# Patient Record
Sex: Male | Born: 1990 | Race: White | Hispanic: No | Marital: Single | Smoking: Current every day smoker
Health system: Southern US, Community
[De-identification: ages and names within clinical notes are randomized; demographics above are authoritative.]

---
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
## 2009-10-27 ENCOUNTER — Emergency Department: Payer: Self-pay | Admitting: Emergency Medicine

## 2009-11-02 ENCOUNTER — Emergency Department: Payer: Self-pay | Admitting: Emergency Medicine

## 2009-11-04 ENCOUNTER — Emergency Department: Payer: Self-pay | Admitting: Emergency Medicine

## 2011-05-08 ENCOUNTER — Emergency Department: Payer: Self-pay | Admitting: Emergency Medicine

## 2012-05-19 ENCOUNTER — Observation Stay: Payer: Self-pay | Admitting: Surgery

## 2012-05-19 LAB — COMPREHENSIVE METABOLIC PANEL
BUN: 19 mg/dL — ABNORMAL HIGH (ref 7–18)
Chloride: 105 mmol/L (ref 98–107)
EGFR (African American): >60
Potassium: 4.7 mmol/L (ref 3.5–5.1)
SGOT(AST): 27 U/L (ref 15–37)
Total Protein: 8.2 g/dL (ref 6.4–8.2)

## 2012-05-19 LAB — URINALYSIS, COMPLETE
Bacteria: NONE SEEN
Bilirubin,UR: NEGATIVE
Blood: NEGATIVE
Glucose,UR: NEGATIVE mg/dL (ref 0–75)
Ketone: NEGATIVE
Leukocyte Esterase: NEGATIVE
Nitrite: NEGATIVE
Protein: NEGATIVE
Specific Gravity: 1.03 (ref 1.003–1.030)
Squamous Epithelial: NONE SEEN
WBC UR: 3 /HPF (ref 0–5)

## 2012-05-19 LAB — CBC
HCT: 47.7 % (ref 40.0–52.0)
HGB: 16.1 g/dL (ref 13.0–18.0)
MCHC: 33.7 g/dL (ref 32.0–36.0)
RBC: 5.34 10*6/uL (ref 4.40–5.90)

## 2012-09-12 ENCOUNTER — Emergency Department: Payer: Self-pay | Admitting: Emergency Medicine

## 2012-09-12 LAB — CBC WITH DIFFERENTIAL/PLATELET
Eosinophil #: 0.3 10*3/uL (ref 0.0–0.7)
Lymphocyte #: 2.1 10*3/uL (ref 1.0–3.6)
MCH: 28.9 pg (ref 26.0–34.0)
MCV: 86 fL (ref 80–100)
RDW: 13.1 % (ref 11.5–14.5)

## 2012-09-13 ENCOUNTER — Inpatient Hospital Stay: Payer: Self-pay | Admitting: Internal Medicine

## 2012-09-13 LAB — CBC WITH DIFFERENTIAL/PLATELET
Basophil %: 0.7 %
Eosinophil #: 0.4 10*3/uL (ref 0.0–0.7)
HCT: 43.4 % (ref 40.0–52.0)
Lymphocyte #: 1.9 10*3/uL (ref 1.0–3.6)
MCH: 29.2 pg (ref 26.0–34.0)
MCV: 86 fL (ref 80–100)
Neutrophil #: 8 10*3/uL — ABNORMAL HIGH (ref 1.4–6.5)
Platelet: 302 10*3/uL (ref 150–440)
RBC: 5.07 10*6/uL (ref 4.40–5.90)

## 2014-01-23 ENCOUNTER — Emergency Department: Payer: Self-pay | Admitting: Internal Medicine

## 2014-03-05 ENCOUNTER — Inpatient Hospital Stay: Payer: Self-pay | Admitting: Internal Medicine

## 2014-03-05 LAB — COMPREHENSIVE METABOLIC PANEL
ALBUMIN: 4 g/dL (ref 3.4–5.0)
ALT: 43 U/L
ANION GAP: 5 — AB (ref 7–16)
AST: 32 U/L (ref 15–37)
Alkaline Phosphatase: 83 U/L
BUN: 18 mg/dL (ref 7–18)
Bilirubin,Total: 0.3 mg/dL (ref 0.2–1.0)
Calcium, Total: 8.6 mg/dL (ref 8.5–10.1)
Chloride: 105 mmol/L (ref 98–107)
Co2: 29 mmol/L (ref 21–32)
Creatinine: 1.3 mg/dL (ref 0.60–1.30)
EGFR (African American): >60
Glucose: 132 mg/dL — ABNORMAL HIGH (ref 65–99)
Osmolality: 281 (ref 275–301)
POTASSIUM: 4.2 mmol/L (ref 3.5–5.1)
Sodium: 139 mmol/L (ref 136–145)
TOTAL PROTEIN: 7.7 g/dL (ref 6.4–8.2)

## 2014-03-05 LAB — CBC
HCT: 46 % (ref 40.0–52.0)
HGB: 15.3 g/dL (ref 13.0–18.0)
MCH: 29.9 pg (ref 26.0–34.0)
MCHC: 33.3 g/dL (ref 32.0–36.0)
MCV: 90 fL (ref 80–100)
Platelet: 256 10*3/uL (ref 150–440)
RBC: 5.13 10*6/uL (ref 4.40–5.90)
RDW: 13.1 % (ref 11.5–14.5)
WBC: 10.5 10*3/uL (ref 3.8–10.6)

## 2014-03-10 LAB — CULTURE, BLOOD (SINGLE)
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
## 2014-06-28 NOTE — H&P (Signed)
PATIENT NAME:  Gordon Peters, Petar MR#:  [id number] DATE OF BIRTH:  August 10, 1990  DATE OF ADMISSION:  05/19/2012  CHIEF COMPLAINT:  Right lower quadrant pain.   HISTORY OF PRESENT ILLNESS:  This is a patient with a 16 to 18 hour history of bilateral lower quadrant pain, right greater than left, nausea, multiple emesis, normal bowel movement, no diarrhea.  No fevers or chills.  Of note, he is worsening in the right lower quadrant and less so in the left.   He states he has had episodes like this in the past once requiring one week of admission to the hospital with observation.  No surgical procedures were performed.  This was several years ago.  His appendix has never been removed and he has no family history of Crohn's disease.   PAST MEDICAL HISTORY:  Remote asthma.  He has not had any medications in years.   PAST SURGICAL HISTORY:  Knee surgery multiples and dental surgery.   ALLERGIES:  ASPIRIN.   MEDICATIONS:  None.   FAMILY HISTORY:  Noncontributory.   SOCIAL HISTORY:  The patient smokes tobacco.  Does not drink alcohol to excess and works as a Pharmacist, communitycarnival ride operator.   REVIEW OF SYSTEMS:  A 10 system review has been performed and negative with the exception of that mentioned in the history of present illness.   PHYSICAL EXAMINATION: GENERAL:  Healthy, muscular-appearing Caucasian male patient.  VITAL SIGNS:  Temp of 98.8, pulse 102, respirations 19, blood pressure 137/75.  Pain scale of 8.  98% room air sat.  HEENT:  Shows no scleral icterus.  No palpable neck nodes.  CHEST:  Clear to auscultation.  CARDIAC:  Regular rate and rhythm.  ABDOMEN:  Soft, nondistended.  Tender bilaterally, but, maximally in the right lower quadrant with some guarding and a positive Rovsing's sign.  No percussion tenderness.  EXTREMITIES:  Without edema.  Calves are nontender.  NEUROLOGIC:  Grossly intact.  INTEGUMENT:  Shows multiple tattoos.   LABORATORY DATA:  White blood cell count is elevated.  Other  labs are normal.   CT scan is personally reviewed.  There is a suggestion of early appendicitis noted.   ASSESSMENT AND PLAN:  This is a patient with a history of prior episodes similar to this, one of which required hospitalization and observation, however presents with right lower quadrant pain and tenderness and leukocytosis and a questionable CT scan, but physical examination findings of appendicitis.  I have recommended laparoscopic appendectomy.  The rationale for this has been discussed.  The options of observation reviewed and the risks of bleeding, infection, recurrence, negative laparoscopy and alternative causes were reviewed with he and his significant other.  They understood and agreed to proceed.     ____________________________ Adah Salvageichard E. Excell Seltzerooper, MD rec:ea D: 05/19/2012 20:31:37 ET T: 05/20/2012 03:08:38 ET JOB#: [id number]  cc: Adah Salvageichard E. Excell Seltzerooper, MD, <Dictator> Lattie HawICHARD E COOPER MD ELECTRONICALLY SIGNED 05/20/2012 6:53

## 2014-06-28 NOTE — H&P (Signed)
   Subjective/Chief Complaint lq pain   History of Present Illness 18 hrs bilat lq pain, rt greater than left prev episodes nausea, mult emesis nml BM, no F/C   Past History PMH asthma PSH knee and dental   Past Med/Surgical Hx:  Asthma:   None, patient reports no surgical history.:   ALLERGIES:  Aspirin: Hives, Swelling  Family and Social History:  Family History Non-Contributory   Social History positive  tobacco, negative ETOH, Insurance claims handler of [employer]   Review of Systems:  Fever/Chills No   Cough No   Abdominal Pain Yes   Diarrhea No   Constipation No   Nausea/Vomiting Yes   SOB/DOE No   Chest Pain No   Dysuria No   Tolerating Diet No  Nauseated  Vomiting   Physical Exam:  GEN no acute distress   HEENT pink conjunctivae   NECK supple   RESP normal resp effort  clear BS  no use of accessory muscles   CARD regular rate   ABD positive tenderness  soft  RLQ, pos Rosving's, guarding   LYMPH negative neck   EXTR negative edema   SKIN normal to palpation   PSYCH alert, A+O to time, place, person, good insight   Lab Results: Hepatic:  14-Mar-14 14:52   Bilirubin, Total 0.6  Alkaline Phosphatase 90  SGPT (ALT) 51  SGOT (AST) 27  Total Protein, Serum 8.2  Albumin, Serum 4.1  Routine Chem:  14-Mar-14 14:52   Glucose, Serum  143  BUN  19  Creatinine (comp) 1.01  Sodium, Serum 137  Potassium, Serum 4.7  Chloride, Serum 105  CO2, Serum 31  Calcium (Total), Serum 8.8  Osmolality (calc) 279  eGFR (African American) >60  eGFR (Non-African American) >60 (eGFR values <4mL/min/1.73 m2 may be an indication of chronic kidney disease (CKD). Calculated eGFR is useful in patients with stable renal function. The eGFR calculation will not be reliable in acutely ill patients when serum creatinine is changing rapidly. It is not useful in  patients on dialysis. The eGFR calculation may not be applicable to patients at the low and  high extremes of body sizes, pregnant women, and vegetarians.)  Anion Gap  1  Routine UA:  14-Mar-14 14:52   Color (UA) Yellow  Clarity (UA) Cloudy  Glucose (UA) Negative  Bilirubin (UA) Negative  Ketones (UA) Negative  Specific Gravity (UA) 1.030  Blood (UA) Negative  pH (UA) 5.0  Protein (UA) Negative  Nitrite (UA) Negative  Leukocyte Esterase (UA) Negative (Result(s) reported on 19 May 2012 at 03:55PM.)  RBC (UA) 1 /HPF  WBC (UA) 3 /HPF  Bacteria (UA) NONE SEEN  Epithelial Cells (UA) NONE SEEN  Mucous (UA) PRESENT (Result(s) reported on 19 May 2012 at 03:55PM.)  Routine Hem:  14-Mar-14 14:52   WBC (CBC)  20.5  RBC (CBC) 5.34  Hemoglobin (CBC) 16.1  Hematocrit (CBC) 47.7  Platelet Count (CBC) 286 (Result(s) reported on 19 May 2012 at 03:01PM.)  MCV 89  MCH 30.1  MCHC 33.7  RDW 13.2    Assessment/Admission Diagnosis CT rev'd hx and pe suggests appendicitis rec laparoscopy risks/options   Electronic Signatures: Florene Glen (MD)  (Signed 14-Mar-14 20:28)  Authored: CHIEF COMPLAINT and HISTORY, PAST MEDICAL/SURGIAL HISTORY, ALLERGIES, FAMILY AND SOCIAL HISTORY, REVIEW OF SYSTEMS, PHYSICAL EXAM, LABS, ASSESSMENT AND PLAN   Last Updated: 14-Mar-14 20:28 by Florene Glen (MD)

## 2014-06-28 NOTE — Op Note (Signed)
  DATE OF BIRTH:  November 08, 1990  DATE OF PROCEDURE:  05/19/2012  PREOPERATIVE DIAGNOSIS: Acute appendicitis.   POSTOPERATIVE DIAGNOSIS: Acute appendicitis.   PROCEDURE:  Laparoscopic appendectomy.   SURGEON:  Richard E. Excell Seltzerooper, M.D.   ANESTHESIA:  General with endotracheal tube.   INDICATIONS:  This is a patient with progressive right lower quadrant pain and tenderness, with a workup suggestive of acute appendicitis. Preoperatively we discussed rationale for surgery, the options of observation, risk of bleeding, infection, recurrence, negative laparoscopy and an open procedure. This was all reviewed for him. He understood and agreed to proceed.  FINDINGS:  Acute appendicitis, nonruptured, retrocecal position.   DESCRIPTION OF PROCEDURE: The patient was induced with general anesthesia, given IV antibiotics, a Foley catheter was placed. He was prepped and draped in sterile fashion. Marcaine was infiltrated in skin and subcutaneous tissues around the periumbilical area. Incision was made, Veress needle was placed. Pneumoperitoneum was obtained. A 5 mm trocar port was placed. The abdominal cavity was explored, and under direct vision a 5 mm suprapubic port and a left lateral 12 mm port was placed. The appendix was identified in the right lower quadrant, but was appearing to track retrocecally. Sharp dissection of some lateral reflection of peritoneum was performed to allow for elevation of the retrocecal appendix. It was found to be inflamed at the tip. The base of the appendix was divided with a standard load Endo GIA, and the vascular load Endo GIA was fired across the mesoappendix, and the specimen was passed out through the lateral port site with the aid of an Endo Catch bag. The area was checked for hemostasis and found to be adequate. There was no sign of bleeding or bowel injury. The left lateral port site was closed with multiple simple sutures of 0 Vicryl utilizing an Endo Close technique under  direct vision. Then pneumoperitoneum was released. All ports removed, and 4-0 subcuticular Monocryl was used on all skin edges. Steri-Strips, Mastisol and sterile dressings were placed.   The patient tolerated the procedure well. There were no complications. He was taken to the recovery room in stable condition, to be admitted for continued care.      ____________________________ Adah Salvageichard E. Excell Seltzerooper, MD rec:mr D: 05/19/2012 22:23:26 ET T: 05/20/2012 15:11:07 ET JOB#: [id number]  cc: Adah Salvageichard E. Excell Seltzerooper, MD, <Dictator> Lattie HawICHARD E COOPER MD ELECTRONICALLY SIGNED 05/24/2012 12:28
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
## 2015-09-01 DIAGNOSIS — R21 Rash and other nonspecific skin eruption: Secondary | ICD-10-CM | POA: Insufficient documentation

## 2015-09-01 DIAGNOSIS — Y929 Unspecified place or not applicable: Secondary | ICD-10-CM | POA: Insufficient documentation

## 2015-09-01 DIAGNOSIS — Y939 Activity, unspecified: Secondary | ICD-10-CM | POA: Insufficient documentation

## 2015-09-01 DIAGNOSIS — W57XXXA Bitten or stung by nonvenomous insect and other nonvenomous arthropods, initial encounter: Secondary | ICD-10-CM | POA: Insufficient documentation

## 2015-09-01 DIAGNOSIS — Y999 Unspecified external cause status: Secondary | ICD-10-CM | POA: Insufficient documentation

## 2015-09-01 DIAGNOSIS — S20469A Insect bite (nonvenomous) of unspecified back wall of thorax, initial encounter: Secondary | ICD-10-CM | POA: Insufficient documentation

## 2015-09-01 NOTE — ED Notes (Addendum)
Pt noted a tick on back Thursday states now area seems red and swollen. Today noted red painful area to navel unsure of cause.

## 2015-09-02 ENCOUNTER — Emergency Department
Admission: EM | Admit: 2015-09-02 | Discharge: 2015-09-02 | Disposition: A | Discharge status: Home / Self Care | Payer: Self-pay | Attending: Emergency Medicine | Admitting: Emergency Medicine

## 2015-09-02 DIAGNOSIS — W57XXXA Bitten or stung by nonvenomous insect and other nonvenomous arthropods, initial encounter: Secondary | ICD-10-CM

## 2015-09-02 DIAGNOSIS — R21 Rash and other nonspecific skin eruption: Secondary | ICD-10-CM

## 2015-09-02 MED ORDER — DOXYCYCLINE HYCLATE 100 MG PO CAPS: 100.0000 mg | ORAL_CAPSULE | Freq: Two times a day (BID) | ORAL | Status: AC

## 2015-09-02 NOTE — ED Provider Notes (Signed)
[facility] Emergency Department Provider Note  ____________________________________________  Time seen: 1:40 AM  I have reviewed the triage vital signs and the nursing notes.   HISTORY  Chief Complaint Wound Infection    HPI Gordon Peters. is a 25 y.o. male who complains of red swollen area on the middle of his back in an area where he had a recent tick bite. He was not sure how long the tick was on there, but noticed it about 4 days ago. Since then has had progressively enlarging erythematous area. He also has felt some generalized malaise and fatigue and body aches. No joint pains or swelling. No fever. No palpitations.     No past medical history on file. none  There are no active problems to display for this patient.    No past surgical history on file. none  Current Outpatient Rx  Name  Route  Sig  Dispense  Refill  . doxycycline (VIBRAMYCIN) 100 MG capsule   Oral   Take 1 capsule (100 mg total) by mouth 2 (two) times daily.   28 capsule   0      Allergies Aspirin   No family history on file.  Social History Social History  Substance Use Topics  . Smoking status: Not on file  . Smokeless tobacco: Not on file  . Alcohol Use: Not on file  No tobacco alcohol or drug use.  Works in [place]  Constitutional:   No fever or chills.  ENT:   No sore throat. No rhinorrhea. Cardiovascular:   No chest pain. Respiratory:   No dyspnea or cough. Gastrointestinal:   Negative for abdominal pain, vomiting and diarrhea. Positive erythema in the umbilicus. Genitourinary:   Negative for dysuria or difficulty urinating. Musculoskeletal:   Negative for focal pain or swelling Neurological:   Negative for headaches 10-point ROS otherwise negative.  ____________________________________________   PHYSICAL EXAM:  VITAL SIGNS: ED Triage Vitals  Enc Vitals Group     BP 09/01/15 2116 157/81 mmHg     Pulse Rate  09/01/15 2116 95     Resp 09/01/15 2116 20     Temp 09/01/15 2116 98.9 F (37.2 C)     Temp Source 09/01/15 2116 Oral     SpO2 09/01/15 2116 99 %     Weight 09/01/15 2116 155 lb (70.308 kg)     Height 09/01/15 2116 5\' 10"  (1.778 m)     Head Cir --      Peak Flow --      Pain Score 09/01/15 2122 2     Pain Loc --      Pain Edu? --      Excl. in [place]? --     Vital signs reviewed, nursing assessments reviewed.   Constitutional:   Alert and oriented. Well appearing and in no distress. Eyes:   No scleral icterus. No conjunctival pallor. PERRL. EOMI.  No nystagmus. ENT   Hematological/Lymphatic/Immunilogical:   No cervical lymphadenopathy. Cardiovascular:   RRR. Symmetric bilateral radial and DP pulses.  No murmurs.  Respiratory:   Normal respiratory effort without tachypnea nor retractions. Breath sounds are clear and equal bilaterally. No wheezes/rales/rhonchi. Gastrointestinal:   Soft and nontender. Non distended. There is no CVA tenderness.  No rebound, rigidity, or guarding. Genitourinary:   deferred Musculoskeletal:   Nontender with normal range of motion in all extremities. No joint effusions.  No lower extremity tenderness.  No edema. Neurologic:   Normal speech and  language.  CN 2-10 normal. Motor grossly intact. No gross focal neurologic deficits are appreciated.  Skin:   3 cm round erythematous indurated area around a scabbed wound that is 2 or 3 mm in diameter in the middle of the back. No target lesion or erythema multiforme.  Umbilicus has a 23mm area of erythema and tenderness within. Pt is an outie.  No induration or drainage.    ____________________________________________    LABS (pertinent positives/negatives) (all labs ordered are listed, but only abnormal results are displayed) Labs Reviewed - No data to display ____________________________________________   EKG    ____________________________________________     RADIOLOGY    ____________________________________________   PROCEDURES   ____________________________________________   INITIAL IMPRESSION / ASSESSMENT AND PLAN / ED COURSE  Pertinent labs & imaging results that were available during my care of the patient were reviewed by me and considered in my medical decision making (see chart for details).  Patient presents with tick bite of unknown known duration, with surrounding erythema and worsening inflammatory changes. We'll treat with doxycycline due to possible exposure to Lyme or [facility] spotted fever special with his constitutional symptoms he's been having over the last few days. Patient counseled on cost and he says he is sure he'll be able to get the medicine. I counseled to avoid prolonged sun exposure with the doxycycline.     ____________________________________________   FINAL CLINICAL IMPRESSION(S) / ED DIAGNOSES  Final diagnoses:  Tick bite  Rash       Portions of this note were generated with dragon dictation software. Dictation errors may occur despite best attempts at proofreading.   Carrie Mew, MD 09/02/15 0157

## 2015-09-02 NOTE — Discharge Instructions (Signed)
Tick Bite Information Ticks are insects that attach themselves to the skin and draw blood for food. There are various types of ticks. Common types include wood ticks and deer ticks. Most ticks live in shrubs and grassy areas. Ticks can climb onto your body when you make contact with leaves or grass where the tick is waiting. The most common places on the body for ticks to attach themselves are the scalp, neck, armpits, waist, and groin. Most tick bites are harmless, but sometimes ticks carry germs that cause diseases. These germs can be spread to a person during the tick's feeding process. The chance of a disease spreading through a tick bite depends on:   The type of tick.  Time of year.   How long the tick is attached.   Geographic location.  HOW CAN YOU PREVENT TICK BITES? Take these steps to help prevent tick bites when you are outdoors:  Wear protective clothing. Long sleeves and long pants are best.   Wear white clothes so you can see ticks more easily.  Tuck your pant legs into your socks.   If walking on a trail, stay in the middle of the trail to avoid brushing against bushes.  Avoid walking through areas with long grass.  Put insect repellent on all exposed skin and along boot tops, pant legs, and sleeve cuffs.   Check clothing, hair, and skin repeatedly and before going inside.   Brush off any ticks that are not attached.  Take a shower or bath as soon as possible after being outdoors.  WHAT IS THE PROPER WAY TO REMOVE A TICK? Ticks should be removed as soon as possible to help prevent diseases caused by tick bites. 1. If latex gloves are available, put them on before trying to remove a tick.  2. Using fine-point tweezers, grasp the tick as close to the skin as possible. You may also use curved forceps or a tick removal tool. Grasp the tick as close to its head as possible. Avoid grasping the tick on its body. 3. Pull gently with steady upward pressure until  the tick lets go. Do not twist the tick or jerk it suddenly. This may break off the tick's head or mouth parts. 4. Do not squeeze or crush the tick's body. This could force disease-carrying fluids from the tick into your body.  5. After the tick is removed, wash the bite area and your hands with soap and water or other disinfectant such as alcohol. 6. Apply a small amount of antiseptic cream or ointment to the bite site.  7. Wash and disinfect any instruments that were used.  Do not try to remove a tick by applying a hot match, petroleum jelly, or fingernail polish to the tick. These methods do not work and may increase the chances of disease being spread from the tick bite.  WHEN SHOULD YOU SEEK MEDICAL CARE? Contact your health care provider if you are unable to remove a tick from your skin or if a part of the tick breaks off and is stuck in the skin.  After a tick bite, you need to be aware of signs and symptoms that could be related to diseases spread by ticks. Contact your health care provider if you develop any of the following in the days or weeks after the tick bite:  Unexplained fever.  Rash. A circular rash that appears days or weeks after the tick bite may indicate the possibility of Lyme disease. The rash may resemble   a target with a bull's-eye and may occur at a different part of your body than the tick bite.  Redness and swelling in the area of the tick bite.   Tender, swollen lymph glands.   Diarrhea.   Weight loss.   Cough.   Fatigue.   Muscle, joint, or bone pain.   Abdominal pain.   Headache.   Lethargy or a change in your level of consciousness.  Difficulty walking or moving your legs.   Numbness in the legs.   Paralysis.  Shortness of breath.   Confusion.   Repeated vomiting.    This information is not intended to replace advice given to you by your health care provider. Make sure you discuss any questions you have with your health  care provider.   Document Released: 02/20/2000 Document Revised: 03/15/2014 Document Reviewed: 08/02/2012 Elsevier Interactive Patient Education 2016 [employer]  

## 2016-02-27 ENCOUNTER — Emergency Department
Admission: EM | Admit: 2016-02-27 | Discharge: 2016-02-27 | Disposition: A | Discharge status: Home / Self Care | Payer: Self-pay | Attending: Emergency Medicine | Admitting: Emergency Medicine

## 2016-02-27 DIAGNOSIS — F17200 Nicotine dependence, unspecified, uncomplicated: Secondary | ICD-10-CM | POA: Insufficient documentation

## 2016-02-27 DIAGNOSIS — Z79899 Other long term (current) drug therapy: Secondary | ICD-10-CM | POA: Insufficient documentation

## 2016-02-27 DIAGNOSIS — L03114 Cellulitis of left upper limb: Secondary | ICD-10-CM | POA: Insufficient documentation

## 2016-02-27 DIAGNOSIS — L0390 Cellulitis, unspecified: Secondary | ICD-10-CM

## 2016-02-27 LAB — COMPREHENSIVE METABOLIC PANEL
ALK PHOS: 85 U/L (ref 38–126)
ALT: 29 U/L (ref 17–63)
AST: 28 U/L (ref 15–41)
Albumin: 4.9 g/dL (ref 3.5–5.0)
Anion gap: 6 (ref 5–15)
BILIRUBIN TOTAL: 0.8 mg/dL (ref 0.3–1.2)
BUN: 14 mg/dL (ref 6–20)
CALCIUM: 9.4 mg/dL (ref 8.9–10.3)
CO2: 31 mmol/L (ref 22–32)
CREATININE: 1.18 mg/dL (ref 0.61–1.24)
Chloride: 101 mmol/L (ref 101–111)
Glucose, Bld: 162 mg/dL — ABNORMAL HIGH (ref 65–99)
Potassium: 4.5 mmol/L (ref 3.5–5.1)
Sodium: 138 mmol/L (ref 135–145)
TOTAL PROTEIN: 8.4 g/dL — AB (ref 6.5–8.1)

## 2016-02-27 LAB — CBC WITH DIFFERENTIAL/PLATELET
Basophils Absolute: 0.1 10*3/uL (ref 0–0.1)
Basophils Relative: 1 %
EOS PCT: 3 %
Eosinophils Absolute: 0.4 10*3/uL (ref 0–0.7)
HEMATOCRIT: 51.5 % (ref 40.0–52.0)
HEMOGLOBIN: 17.3 g/dL (ref 13.0–18.0)
LYMPHS ABS: 1.5 10*3/uL (ref 1.0–3.6)
LYMPHS PCT: 12 %
MCH: 29.6 pg (ref 26.0–34.0)
MCHC: 33.5 g/dL (ref 32.0–36.0)
MCV: 88.2 fL (ref 80.0–100.0)
Monocytes Absolute: 1 10*3/uL (ref 0.2–1.0)
Monocytes Relative: 8 %
Neutro Abs: 10.2 10*3/uL — ABNORMAL HIGH (ref 1.4–6.5)
Neutrophils Relative %: 76 %
PLATELETS: 306 10*3/uL (ref 150–440)
RBC: 5.84 MIL/uL (ref 4.40–5.90)
RDW: 13.6 % (ref 11.5–14.5)
WBC: 13.2 10*3/uL — AB (ref 3.8–10.6)

## 2016-02-27 MED ORDER — CEPHALEXIN 500 MG PO CAPS: 500.0000 mg | ORAL_CAPSULE | Freq: Four times a day (QID) | ORAL | 0 refills | Status: AC

## 2016-02-27 MED ORDER — SULFAMETHOXAZOLE-TRIMETHOPRIM 800-160 MG PO TABS
ORAL_TABLET | ORAL | Status: AC
  Administered 2016-02-27: 2 via ORAL
  Filled 2016-02-27: qty 1

## 2016-02-27 MED ORDER — SULFAMETHOXAZOLE-TRIMETHOPRIM 800-160 MG PO TABS: 2.0000 | ORAL_TABLET | Freq: Two times a day (BID) | ORAL | 0 refills | Status: AC

## 2016-02-27 MED ORDER — SULFAMETHOXAZOLE-TRIMETHOPRIM 800-160 MG PO TABS
2.0000 | ORAL_TABLET | Freq: Once | ORAL | Status: AC
  Administered 2016-02-27: 2 via ORAL
  Filled 2016-02-27: qty 2

## 2016-02-27 MED ORDER — CEPHALEXIN 500 MG PO CAPS
500.0000 mg | ORAL_CAPSULE | Freq: Once | ORAL | Status: AC
  Administered 2016-02-27: 500 mg via ORAL

## 2016-02-27 MED ORDER — SULFAMETHOXAZOLE-TRIMETHOPRIM 800-160 MG PO TABS
2.0000 | ORAL_TABLET | Freq: Once | ORAL | Status: AC
  Administered 2016-02-27: 2 via ORAL

## 2016-02-27 MED ORDER — CEPHALEXIN 500 MG PO CAPS
ORAL_CAPSULE | ORAL | Status: AC
  Administered 2016-02-27: 500 mg via ORAL
  Filled 2016-02-27: qty 1

## 2016-02-27 MED ORDER — CEPHALEXIN 500 MG PO CAPS
500.0000 mg | ORAL_CAPSULE | Freq: Once | ORAL | Status: AC
  Administered 2016-02-27: 500 mg via ORAL
  Filled 2016-02-27 (×2): qty 1

## 2016-02-27 NOTE — ED Provider Notes (Signed)
[facility] Emergency Department Provider Note  ____________________________________________   First MD Initiated Contact with Patient 02/27/16 1525     (approximate)  I have reviewed the triage vital signs and the nursing notes.   HISTORY  Chief Complaint Insect Bite   HPI Gordon PyoWilliam Plancarte Jr. is a 25 y.o. male without any chronic medical problems was presenting emergency department todaywith redness to the left upper extremity which started yesterday. He says that he woke up from a nap and had to insect bites. There was a small patch of redness around the insect bites with itching. He says that since then it has worsened and spread to the majority of his left arm. It is not spread onto his torso or his forearm. He denies any fever. Says that he has tried insect wipes at home as well as Benadryl which have not eased his itching and pain.   History reviewed. No pertinent past medical history.  There are no active problems to display for this patient.   History reviewed. No pertinent surgical history.  Prior to Admission medications   Medication Sig Start Date End Date Taking? Authorizing Provider  doxycycline (VIBRAMYCIN) 100 MG capsule Take 1 capsule (100 mg total) by mouth 2 (two) times daily. 09/02/15   Sharman CheekPhillip Stafford, MD    Allergies Aspirin  No family history on file.  Social History Social History  Substance Use Topics  . Smoking status: Current Every Day Smoker  . Smokeless tobacco: Not on file  . Alcohol use Yes    Review of Systems Constitutional: No fever/chills Eyes: No visual changes. ENT: No sore throat. Cardiovascular: Denies chest pain. Respiratory: Denies shortness of breath. Gastrointestinal: No abdominal pain.  No nausea, no vomiting.  No diarrhea.  No constipation. Genitourinary: Negative for dysuria. Musculoskeletal: Negative for back pain. Skin: As above Neurological: Negative for headaches, focal weakness or  numbness.  10-point ROS otherwise negative.  ____________________________________________   PHYSICAL EXAM:  VITAL SIGNS: ED Triage Vitals [02/27/16 1459]  Enc Vitals Group     BP (!) 136/91     Pulse Rate 84     Resp 16     Temp 98.3 F (36.8 C)     Temp Source Oral     SpO2 100 %     Weight 155 lb (70.3 kg)     Height 5\' 10"  (1.778 m)     Head Circumference      Peak Flow      Pain Score 5     Pain Loc      Pain Edu?      Excl. in [place]?     Constitutional: Alert and oriented. Well appearing and in no acute distress. Eyes: Conjunctivae are normal. PERRL. EOMI. Head: Atraumatic. Nose: No congestion/rhinnorhea. Mouth/Throat: Mucous membranes are moist.   Neck: No stridor.   Cardiovascular: Normal rate, regular rhythm. Grossly normal heart sounds.   Respiratory: Normal respiratory effort.  No retractions. Lungs CTAB. Gastrointestinal: Soft and nontender. No distention.  Musculoskeletal: No lower extremity tenderness nor edema.  No joint effusions. Neurologic:  Normal speech and language. No gross focal neurologic deficits are appreciated. No gait instability. Skin:  Left upper extremity erythema which is circumferential and extending from just proximal to the antecubital fossa up to the axilla. It does not extend into the axilla or onto the trunk. It does not extend to the anterior fossa or forearm. Pain is not out of proportion to exam. There is no fluctuance. There is  no induration or pus. 2 nodules, approximately 1 cm were palpated laterally which identified as the "bug bites." There is no target lesion. Psychiatric: Mood and affect are normal. Speech and behavior are normal.  ____________________________________________   LABS (all labs ordered are listed, but only abnormal results are displayed)  Labs Reviewed  CBC WITH DIFFERENTIAL/PLATELET - Abnormal; Notable for the following:       Result Value   WBC 13.2 (*)    Neutro Abs 10.2 (*)    All other components  within normal limits  COMPREHENSIVE METABOLIC PANEL - Abnormal; Notable for the following:    Glucose, Bld 162 (*)    Total Protein 8.4 (*)    All other components within normal limits   ____________________________________________  EKG   ____________________________________________  RADIOLOGY   ____________________________________________   PROCEDURES  Procedure(s) performed: None  Procedures  Critical Care performed: No  ____________________________________________   INITIAL IMPRESSION / ASSESSMENT AND PLAN / ED COURSE  Pertinent labs & imaging results that were available during my care of the patient were reviewed by me and considered in my medical decision making (see chart for details).    Clinical Course   Patient with allergic reaction versus cellulitis. Due to the rapid spread of erythema patient will be given Keflex and Bactrim. We discussed strict return precautions such as worsening erythema or fever or other systemic symptoms. Patient also says that he will try Zyrtec at home because his Benadryl makes him drowsy. The shop with itching. The patient will be discharged home. He is understanding of the plan and willing to comply.   ____________________________________________   FINAL CLINICAL IMPRESSION(S) / ED DIAGNOSES  Cellulitis.    NEW MEDICATIONS STARTED DURING THIS VISIT:  New Prescriptions   No medications on file     Note:  This document was prepared using Dragon voice recognition software and may include unintentional dictation errors.    Myrna Blazeravid Matthew Kirstin Kugler, MD 02/27/16 [id number]

## 2016-02-27 NOTE — ED Triage Notes (Signed)
Pt states that he was bitten by something while sleeping yesterday. Redness to left bicep and running down left arm. Tender to touch.

## 2016-02-27 NOTE — ED Notes (Signed)
CBC collected, R AC, sent to lab. 

## 2016-06-02 ENCOUNTER — Emergency Department
Admission: EM | Admit: 2016-06-02 | Discharge: 2016-06-02 | Disposition: A | Discharge status: Home / Self Care | Payer: Self-pay | Attending: Emergency Medicine | Admitting: Emergency Medicine

## 2016-06-02 DIAGNOSIS — F17200 Nicotine dependence, unspecified, uncomplicated: Secondary | ICD-10-CM | POA: Insufficient documentation

## 2016-06-02 DIAGNOSIS — J0100 Acute maxillary sinusitis, unspecified: Secondary | ICD-10-CM | POA: Insufficient documentation

## 2016-06-02 MED ORDER — FEXOFENADINE-PSEUDOEPHED ER 60-120 MG PO TB12: 1.0000 | ORAL_TABLET | Freq: Two times a day (BID) | ORAL | 0 refills | Status: AC

## 2016-06-02 MED ORDER — TRAMADOL HCL 50 MG PO TABS: 50.0000 mg | ORAL_TABLET | Freq: Four times a day (QID) | ORAL | 0 refills | Status: AC | PRN

## 2016-06-02 MED ORDER — AMOXICILLIN 500 MG PO CAPS: 500.0000 mg | ORAL_CAPSULE | Freq: Three times a day (TID) | ORAL | 0 refills | Status: AC

## 2016-06-02 MED ORDER — METHYLPREDNISOLONE SODIUM SUCC 125 MG IJ SOLR
125.0000 mg | Freq: Once | INTRAMUSCULAR | Status: AC
  Administered 2016-06-02: 125 mg via INTRAMUSCULAR
  Filled 2016-06-02: qty 2

## 2016-06-02 NOTE — ED Provider Notes (Signed)
[facility] Emergency Department Provider Note   ____________________________________________   First MD Initiated Contact with Patient 06/02/16 [phone]     (approximate)  I have reviewed the triage vital signs and the nursing notes.   HISTORY  Chief Complaint Facial Pain    HPI Gordon PyoWilliam Pellecchia Jr. is a 26 y.o. male patient stated one week sinus congestion and pressure. Patient states last night pain became worse and seemed concentrated on the right side. Patient also complaining of watery eyes. Patient denies fever. Patient rates pain as a 7/10. Patient stated no relief over-the-counter medications and heating pack.  No past medical history on file.  There are no active problems to display for this patient.   No past surgical history on file.  Prior to Admission medications   Medication Sig Start Date End Date Taking? Authorizing Provider  amoxicillin (AMOXIL) 500 MG capsule Take 1 capsule (500 mg total) by mouth 3 (three) times daily. 06/02/16   Joni Reiningonald K Smith, PA-C  doxycycline (VIBRAMYCIN) 100 MG capsule Take 1 capsule (100 mg total) by mouth 2 (two) times daily. 09/02/15   Sharman CheekPhillip Stafford, MD  fexofenadine-pseudoephedrine (ALLEGRA-D) 60-120 MG 12 hr tablet Take 1 tablet by mouth 2 (two) times daily. 06/02/16   Joni Reiningonald K Smith, PA-C  sulfamethoxazole-trimethoprim (BACTRIM DS,SEPTRA DS) 800-160 MG tablet Take 2 tablets by mouth 2 (two) times daily. 02/27/16   Myrna Blazeravid Matthew Schaevitz, MD  traMADol (ULTRAM) 50 MG tablet Take 1 tablet (50 mg total) by mouth every 6 (six) hours as needed for moderate pain. 06/02/16   Joni Reiningonald K Smith, PA-C    Allergies Aspirin  No family history on file.  Social History Social History  Substance Use Topics  . Smoking status: Current Every Day Smoker  . Smokeless tobacco: Not on file  . Alcohol use Yes    Review of Systems Constitutional: No fever/chills Eyes: No visual changes. ENT: No sore throat. Sinus  pressure Cardiovascular: Denies chest pain. Respiratory: Denies shortness of breath. Gastrointestinal: No abdominal pain.  No nausea, no vomiting.  No diarrhea.  No constipation. Genitourinary: Negative for dysuria. Musculoskeletal: Negative for back pain. Skin: Negative for rash. Neurological: Positive for headaches, but denies focal weakness or numbness. Allergic/Immunilogical: Aspirin _________________________________________   PHYSICAL EXAM:  VITAL SIGNS: ED Triage Vitals  Enc Vitals Group     BP 06/02/16 0601 (!) 153/100     Pulse Rate 06/02/16 0601 (!) 102     Resp 06/02/16 0601 18     Temp 06/02/16 0601 98.8 F (37.1 C)     Temp Source 06/02/16 0601 Oral     SpO2 06/02/16 0601 98 %     Weight 06/02/16 0559 155 lb (70.3 kg)     Height 06/02/16 0559 5\' 10"  (1.778 m)     Head Circumference --      Peak Flow --      Pain Score 06/02/16 0559 7     Pain Loc --      Pain Edu? --      Excl. in [place]? --     Constitutional: Alert and oriented. Well appearing and in no acute distress. Eyes: Conjunctivae are normal. PERRL. EOMI. Head: Atraumatic. Nose: Right maxillary guarding. Edentulous nasal turbinates. Mouth/Throat: Mucous membranes are moist.  Oropharynx non-erythematous. Neck: No stridor.  No cervical spine tenderness to palpation. Hematological/Lymphatic/Immunilogical: No cervical lymphadenopathy. Cardiovascular: Normal rate, regular rhythm. Grossly normal heart sounds.  Good peripheral circulation. Elevated blood pressure Respiratory: Normal respiratory effort.  No retractions.  Lungs CTAB. Gastrointestinal: Soft and nontender. No distention. No abdominal bruits. No CVA tenderness. Musculoskeletal: No lower extremity tenderness nor edema.  No joint effusions. Neurologic:  Normal speech and language. No gross focal neurologic deficits are appreciated. No gait instability. Skin:  Skin is warm, dry and intact. No rash noted. Psychiatric: Mood and affect are normal. Speech  and behavior are normal.  ____________________________________________   LABS (all labs ordered are listed, but only abnormal results are displayed)  Labs Reviewed - No data to display ____________________________________________  EKG   ____________________________________________  RADIOLOGY   ____________________________________________   PROCEDURES  Procedure(s) performed: None  Procedures  Critical Care performed: No  ____________________________________________   INITIAL IMPRESSION / ASSESSMENT AND PLAN / ED COURSE  Pertinent labs & imaging results that were available during my care of the patient were reviewed by me and considered in my medical decision making (see chart for details).  Maxillary sinusitis. Patient given discharge care instructions. Patient given a prescription for amoxicillin, Allegra-D, tramadol. Patient given a work note. Patient advised [facility] condition [facility].      ____________________________________________   FINAL CLINICAL IMPRESSION(S) / ED DIAGNOSES  Final diagnoses:  Acute non-recurrent maxillary sinusitis      NEW MEDICATIONS STARTED DURING THIS VISIT:  New Prescriptions   AMOXICILLIN (AMOXIL) 500 MG CAPSULE    Take 1 capsule (500 mg total) by mouth 3 (three) times daily.   FEXOFENADINE-PSEUDOEPHEDRINE (ALLEGRA-D) 60-120 MG 12 HR TABLET    Take 1 tablet by mouth 2 (two) times daily.   TRAMADOL (ULTRAM) 50 MG TABLET    Take 1 tablet (50 mg total) by mouth every 6 (six) hours as needed for moderate pain.     Note:  This document was prepared using Dragon voice recognition software and may include unintentional dictation errors.    Joni Reining, PA-C 06/02/16 0719    Minna Antis, MD 06/02/16 [phone]

## 2016-06-02 NOTE — ED Triage Notes (Signed)
Pt was working on an old house a week ago and allergies flared up, states since then has had a lot of sinus congestion and pressure. States tonight became worse and has a lot of right sided facial pain and eyes watering.

## 2016-10-13 ENCOUNTER — Emergency Department
Admission: EM | Admit: 2016-10-13 | Discharge: 2016-10-13 | Disposition: A | Discharge status: Home / Self Care | Payer: Self-pay | Attending: Emergency Medicine | Admitting: Emergency Medicine

## 2016-10-13 ENCOUNTER — Emergency Department: Payer: Self-pay

## 2016-10-13 DIAGNOSIS — F17210 Nicotine dependence, cigarettes, uncomplicated: Secondary | ICD-10-CM | POA: Insufficient documentation

## 2016-10-13 DIAGNOSIS — Z79899 Other long term (current) drug therapy: Secondary | ICD-10-CM | POA: Insufficient documentation

## 2016-10-13 DIAGNOSIS — M25562 Pain in left knee: Secondary | ICD-10-CM | POA: Insufficient documentation

## 2016-10-13 MED ORDER — IBUPROFEN 800 MG PO TABS
800.0000 mg | ORAL_TABLET | Freq: Once | ORAL | Status: AC
  Administered 2016-10-13: 800 mg via ORAL
  Filled 2016-10-13: qty 1

## 2016-10-13 MED ORDER — TRAMADOL HCL 50 MG PO TABS
50.0000 mg | ORAL_TABLET | Freq: Once | ORAL | Status: AC
  Administered 2016-10-13: 50 mg via ORAL
  Filled 2016-10-13: qty 1

## 2016-10-13 NOTE — ED Provider Notes (Signed)
[facility] Emergency Department Provider Note   ____________________________________________   First MD Initiated Contact with Patient 10/13/16 1348     (approximate)  I have reviewed the triage vital signs and the nursing notes.   HISTORY  Chief Complaint Knee Pain    HPI Gordon Peters. is a 26 y.o. male patient complaining of 4 days of increasing left knee pain and edema. Patient had a fracture 10 years ago requiring reconstruction surgery. Patient state 4 days ago he was helping move some furniture and when he stepped backwards into a trailer felt a pop in his knee. Since that incident patient has continued pain and edema that increases with extension of the knee. Patient rates the pain as a constant 5/10. Patient stated pain increases to 8/10 with ambulation. No relief with over-the-counter anti-inflammatories and ice packs.  History reviewed. No pertinent past medical history.  There are no active problems to display for this patient.   History reviewed. No pertinent surgical history.  Prior to Admission medications   Medication Sig Start Date End Date Taking? Authorizing Provider  amoxicillin (AMOXIL) 500 MG capsule Take 1 capsule (500 mg total) by mouth 3 (three) times daily. 06/02/16   Joni Reining, PA-C  doxycycline (VIBRAMYCIN) 100 MG capsule Take 1 capsule (100 mg total) by mouth 2 (two) times daily. 09/02/15   Sharman Cheek, MD  fexofenadine-pseudoephedrine (ALLEGRA-D) 60-120 MG 12 hr tablet Take 1 tablet by mouth 2 (two) times daily. 06/02/16   Joni Reining, PA-C  sulfamethoxazole-trimethoprim (BACTRIM DS,SEPTRA DS) 800-160 MG tablet Take 2 tablets by mouth 2 (two) times daily. 02/27/16   Schaevitz, Myra Rude, MD  traMADol (ULTRAM) 50 MG tablet Take 1 tablet (50 mg total) by mouth every 6 (six) hours as needed for moderate pain. 06/02/16   Joni Reining, PA-C    Allergies Aspirin  No family history on file.  Social  History Social History  Substance Use Topics  . Smoking status: Current Every Day Smoker  . Smokeless tobacco: Never Used  . Alcohol use Yes    Review of Systems Constitutional: No fever/chills Eyes: No visual changes. ENT: No sore throat. Cardiovascular: Denies chest pain. Respiratory: Denies shortness of breath. Gastrointestinal: No abdominal pain.  No nausea, no vomiting.  No diarrhea.  No constipation. Genitourinary: Negative for dysuria. Musculoskeletal: Left knee pain Skin: Negative for rash. Neurological: Negative for headaches, focal weakness or numbness. Allergic/Immunilogical: Aspirin ____________________________________________   PHYSICAL EXAM:  VITAL SIGNS: ED Triage Vitals  Enc Vitals Group     BP 10/13/16 1351 (!) 176/99     Pulse Rate 10/13/16 1351 (!) 117     Resp 10/13/16 1351 13     Temp 10/13/16 1351 98.7 F (37.1 C)     Temp Source 10/13/16 1351 Oral     SpO2 10/13/16 1351 99 %     Weight 10/13/16 1351 160 lb (72.6 kg)     Height 10/13/16 1351 5\' 11"  (1.803 m)     Head Circumference --      Peak Flow --      Pain Score 10/13/16 1330 8     Pain Loc --      Pain Edu? --      Excl. in GC? --     Constitutional: Alert and oriented. Well appearing and in no acute distress. Cardiovascular:Tachycardic. Grossly normal heart sounds.  Good peripheral circulation. Elevated blood pressure Respiratory: Normal respiratory effort.  No retractions. Lungs CTAB. Musculoskeletal: No obvious  deformity to the left knee.  No joint effusions. Moderate guarding with palpation of the tibial plateau. Decreased range of motion with extension limited to 170. Atypical gait favoring the left lower extremity. Neurologic:  Normal speech and language. No gross focal neurologic deficits are appreciated. No gait instability. Skin:  Skin is warm, dry and intact. No rash noted. Psychiatric: Mood and affect are normal. Speech and behavior are  normal.  ____________________________________________   LABS (all labs ordered are listed, but only abnormal results are displayed)  Labs Reviewed - No data to display ____________________________________________  EKG   ____________________________________________  RADIOLOGY  Dg Knee Complete 4 Views Left  Result Date: 10/13/2016 CLINICAL DATA:  26 y/o M; 4 days of increasing left knee pain and edema. EXAM: LEFT KNEE - COMPLETE 4+ VIEW COMPARISON:  None. FINDINGS: Screws within the medial femoral condyles without apparent periprosthetic lucency. Screw within the proximal tibial diaphysis is retracted 3 mm and there is faint lucency around the screw which may represent backing out. No acute fracture or dislocation identified. Small well corticated bony bodies about the medial aspect of the joint space, possibly intra-articular. Spurring of tibial spines. Knee joint compartment joint spaces are well maintained. No joint effusion. IMPRESSION: 1. Screw within the proximal tibia diaphysis is retracted 3 mm and there is faint lucency around the screw which may represent backing out. 2. No acute fracture or dislocation identified.  No joint effusion. Electronically Signed   By: Mitzi HansenLance  Furusawa-Stratton M.D.   On: 10/13/2016 14:23    ____________________________________________   PROCEDURES  Procedure(s) performed: None  Procedures  Critical Care performed: No  ____________________________________________   INITIAL IMPRESSION / ASSESSMENT AND PLAN / ED COURSE  Pertinent labs & imaging results that were available during my care of the patient were reviewed by me and considered in my medical decision making (see chart for details).  Knee pain secondary to postsurgical changes. Patient placed in a knee immobilizer and given crutches for ambulation. Patient advised follow[facility] for further evaluation and treatment. Patient given a work note.       ____________________________________________   FINAL CLINICAL IMPRESSION(S) / ED DIAGNOSES  Final diagnoses:  Acute pain of left knee      NEW MEDICATIONS STARTED DURING THIS VISIT:  Discharge Medication List as of 10/13/2016  5:22 PM       Note:  This document was prepared using Dragon voice recognition software and may include unintentional dictation errors.    Joni ReiningSmith, Ronald K, PA-C 10/14/16 [id number]    Emily FilbertWilliams, Jonathan E, MD 10/14/16 [phone]

## 2016-10-13 NOTE — ED Triage Notes (Signed)
Pt c/o left knee pain, states he has a hx of fx from injury years ago and thinks he re injured it over the weekend while moving.

## 2016-10-13 NOTE — ED Notes (Signed)
see downtime paperwork for discharge

## 2016-10-13 NOTE — ED Notes (Signed)
Pt walked to room 41 with limp. Pt vitals taken and pt given ice pack. Ice pack ok'ed by PA Ron.

## 2016-10-14 NOTE — Discharge Instructions (Signed)
Advised to follow-up with [facility] for definitive evaluation and treatment.

## 2016-10-19 ENCOUNTER — Telehealth: Payer: Self-pay | Admitting: Emergency Medicine

## 2016-10-19 NOTE — Telephone Encounter (Signed)
Patient called and left message that the [facility] did not receive the referral in epic.  I faxed carelink report to [phone]

## 2017-05-18 ENCOUNTER — Other Ambulatory Visit: Payer: Self-pay

## 2017-05-18 ENCOUNTER — Emergency Department
Admission: EM | Admit: 2017-05-18 | Discharge: 2017-05-18 | Disposition: A | Discharge status: Home / Self Care | Payer: Self-pay | Attending: Student in an Organized Health Care Education/Training Program | Admitting: Student in an Organized Health Care Education/Training Program

## 2017-05-18 ENCOUNTER — Emergency Department: Payer: Self-pay

## 2017-05-18 ENCOUNTER — Encounter: Payer: Self-pay | Admitting: Emergency Medicine

## 2017-05-18 DIAGNOSIS — Y998 Other external cause status: Secondary | ICD-10-CM | POA: Insufficient documentation

## 2017-05-18 DIAGNOSIS — Y92009 Unspecified place in unspecified non-institutional (private) residence as the place of occurrence of the external cause: Secondary | ICD-10-CM | POA: Insufficient documentation

## 2017-05-18 DIAGNOSIS — Y939 Activity, unspecified: Secondary | ICD-10-CM | POA: Insufficient documentation

## 2017-05-18 DIAGNOSIS — Z79899 Other long term (current) drug therapy: Secondary | ICD-10-CM | POA: Insufficient documentation

## 2017-05-18 DIAGNOSIS — M79642 Pain in left hand: Secondary | ICD-10-CM | POA: Insufficient documentation

## 2017-05-18 DIAGNOSIS — F17200 Nicotine dependence, unspecified, uncomplicated: Secondary | ICD-10-CM | POA: Insufficient documentation

## 2017-05-18 DIAGNOSIS — S0990XA Unspecified injury of head, initial encounter: Secondary | ICD-10-CM | POA: Insufficient documentation

## 2017-05-18 MED ORDER — FLUORESCEIN SODIUM 1 MG OP STRP
ORAL_STRIP | OPHTHALMIC | Status: AC
  Filled 2017-05-18: qty 1

## 2017-05-18 MED ORDER — FLUORESCEIN SODIUM 1 MG OP STRP
1.0000 | ORAL_STRIP | Freq: Once | OPHTHALMIC | Status: AC
  Administered 2017-05-18: 1 via OPHTHALMIC

## 2017-05-18 MED ORDER — TETRACAINE HCL 0.5 % OP SOLN
1.0000 [drp] | Freq: Once | OPHTHALMIC | Status: AC
Start: 2017-05-18 — End: 2017-05-18
  Administered 2017-05-18: 1 [drp] via OPHTHALMIC

## 2017-05-18 MED ORDER — BUTALBITAL-APAP-CAFFEINE 50-325-40 MG PO TABS: 1.0000 | ORAL_TABLET | Freq: Four times a day (QID) | ORAL | 0 refills | Status: AC | PRN

## 2017-05-18 MED ORDER — BUTALBITAL-APAP-CAFFEINE 50-325-40 MG PO TABS
ORAL_TABLET | ORAL | Status: AC
  Filled 2017-05-18: qty 1

## 2017-05-18 MED ORDER — BUTALBITAL-APAP-CAFFEINE 50-325-40 MG PO TABS
1.0000 | ORAL_TABLET | ORAL | Status: DC | PRN
  Administered 2017-05-18: 1 via ORAL

## 2017-05-18 MED ORDER — TETRACAINE HCL 0.5 % OP SOLN
OPHTHALMIC | Status: AC
  Filled 2017-05-18: qty 4
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
